# Patient Record
Sex: Female | Born: 1945 | Race: White | Hispanic: No
Health system: Southern US, Community
[De-identification: ages and names within clinical notes are randomized; demographics above are authoritative.]

---

## 2021-09-16 ENCOUNTER — Other Ambulatory Visit: Payer: Self-pay | Admitting: Internal Medicine

## 2021-09-16 DIAGNOSIS — Z1231 Encounter for screening mammogram for malignant neoplasm of breast: Secondary | ICD-10-CM

## 2021-11-25 ENCOUNTER — Ambulatory Visit
Admission: RE | Admit: 2021-11-25 | Discharge: 2021-11-25 | Disposition: A | Discharge status: Home / Self Care | Payer: Federal, State, Local not specified - PPO | Source: Ambulatory Visit | Attending: Internal Medicine | Admitting: Internal Medicine

## 2021-11-25 DIAGNOSIS — Z1231 Encounter for screening mammogram for malignant neoplasm of breast: Secondary | ICD-10-CM

## 2021-12-12 ENCOUNTER — Other Ambulatory Visit: Payer: Self-pay | Admitting: Internal Medicine

## 2021-12-12 DIAGNOSIS — N63 Unspecified lump in unspecified breast: Secondary | ICD-10-CM

## 2021-12-12 DIAGNOSIS — R928 Other abnormal and inconclusive findings on diagnostic imaging of breast: Secondary | ICD-10-CM

## 2021-12-19 ENCOUNTER — Other Ambulatory Visit: Payer: Federal, State, Local not specified - PPO

## 2021-12-25 ENCOUNTER — Other Ambulatory Visit: Payer: Federal, State, Local not specified - PPO

## 2021-12-29 ENCOUNTER — Ambulatory Visit
Admission: RE | Admit: 2021-12-29 | Discharge: 2021-12-29 | Disposition: A | Discharge status: Home / Self Care | Payer: Federal, State, Local not specified - PPO | Source: Ambulatory Visit | Attending: Internal Medicine | Admitting: Internal Medicine

## 2021-12-29 ENCOUNTER — Inpatient Hospital Stay
Admission: RE | Admit: 2021-12-29 | Discharge: 2021-12-29 | Disposition: A | Discharge status: Home / Self Care | Payer: Self-pay | Source: Ambulatory Visit | Attending: *Deleted | Admitting: *Deleted

## 2021-12-29 ENCOUNTER — Other Ambulatory Visit: Payer: Self-pay | Admitting: *Deleted

## 2021-12-29 DIAGNOSIS — N63 Unspecified lump in unspecified breast: Secondary | ICD-10-CM | POA: Diagnosis present

## 2021-12-29 DIAGNOSIS — Z1231 Encounter for screening mammogram for malignant neoplasm of breast: Secondary | ICD-10-CM

## 2021-12-29 DIAGNOSIS — R928 Other abnormal and inconclusive findings on diagnostic imaging of breast: Secondary | ICD-10-CM | POA: Diagnosis present

## 2021-12-31 ENCOUNTER — Other Ambulatory Visit: Payer: Self-pay | Admitting: Internal Medicine

## 2021-12-31 DIAGNOSIS — N63 Unspecified lump in unspecified breast: Secondary | ICD-10-CM

## 2021-12-31 DIAGNOSIS — R928 Other abnormal and inconclusive findings on diagnostic imaging of breast: Secondary | ICD-10-CM

## 2022-01-01 ENCOUNTER — Inpatient Hospital Stay
Admission: RE | Admit: 2022-01-01 | Discharge: 2022-01-01 | Disposition: A | Discharge status: Home / Self Care | Payer: Self-pay | Source: Ambulatory Visit | Attending: *Deleted | Admitting: *Deleted

## 2022-01-01 ENCOUNTER — Other Ambulatory Visit: Payer: Self-pay | Admitting: *Deleted

## 2022-01-01 DIAGNOSIS — Z1231 Encounter for screening mammogram for malignant neoplasm of breast: Secondary | ICD-10-CM

## 2022-01-15 ENCOUNTER — Ambulatory Visit
Admission: RE | Admit: 2022-01-15 | Discharge: 2022-01-15 | Disposition: A | Discharge status: Home / Self Care | Payer: Federal, State, Local not specified - PPO | Source: Ambulatory Visit | Attending: Internal Medicine | Admitting: Internal Medicine

## 2022-01-15 DIAGNOSIS — R928 Other abnormal and inconclusive findings on diagnostic imaging of breast: Secondary | ICD-10-CM | POA: Insufficient documentation

## 2022-01-15 DIAGNOSIS — N63 Unspecified lump in unspecified breast: Secondary | ICD-10-CM

## 2022-01-15 HISTORY — PX: BREAST BIOPSY: SHX20

## 2022-01-16 LAB — SURGICAL PATHOLOGY

## 2022-01-21 ENCOUNTER — Encounter: Payer: Self-pay | Admitting: *Deleted

## 2022-01-21 NOTE — Progress Notes (Signed)
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  Appointment scheduled for surgical consultation with Dr. Windell Moment 01/28/22.   No further needs at this time.

## 2022-05-19 ENCOUNTER — Other Ambulatory Visit: Payer: Self-pay | Admitting: General Surgery

## 2022-05-19 DIAGNOSIS — D241 Benign neoplasm of right breast: Secondary | ICD-10-CM

## 2022-07-21 ENCOUNTER — Ambulatory Visit
Admission: RE | Admit: 2022-07-21 | Discharge: 2022-07-21 | Disposition: A | Discharge status: Home / Self Care | Payer: Federal, State, Local not specified - PPO | Source: Ambulatory Visit | Attending: General Surgery | Admitting: General Surgery

## 2022-07-21 DIAGNOSIS — D241 Benign neoplasm of right breast: Secondary | ICD-10-CM | POA: Diagnosis present

## 2022-10-15 ENCOUNTER — Other Ambulatory Visit: Payer: Self-pay | Admitting: Surgery

## 2022-10-15 DIAGNOSIS — N63 Unspecified lump in unspecified breast: Secondary | ICD-10-CM

## 2022-11-20 ENCOUNTER — Ambulatory Visit
Admission: RE | Admit: 2022-11-20 | Discharge: 2022-11-20 | Disposition: A | Discharge status: Home / Self Care | Payer: Federal, State, Local not specified - PPO | Source: Ambulatory Visit | Attending: Surgery | Admitting: Surgery

## 2022-11-20 DIAGNOSIS — N63 Unspecified lump in unspecified breast: Secondary | ICD-10-CM | POA: Insufficient documentation

## 2022-11-27 ENCOUNTER — Other Ambulatory Visit: Payer: Federal, State, Local not specified - PPO

## 2023-02-08 IMAGING — MG MM DIGITAL SCREENING BILAT W/ TOMO AND CAD
8 series · 8 of 24 positions shown · non-contrast
Comparison: None available.

CLINICAL DATA: Screening.

EXAM:
DIGITAL SCREENING BILATERAL MAMMOGRAM WITH TOMOSYNTHESIS AND CAD
TECHNIQUE: Bilateral screening digital craniocaudal and mediolateral oblique
mammograms were obtained. Bilateral screening digital breast
tomosynthesis was performed. The images were evaluated with
computer-aided detection.

[L MLO synth-2D]
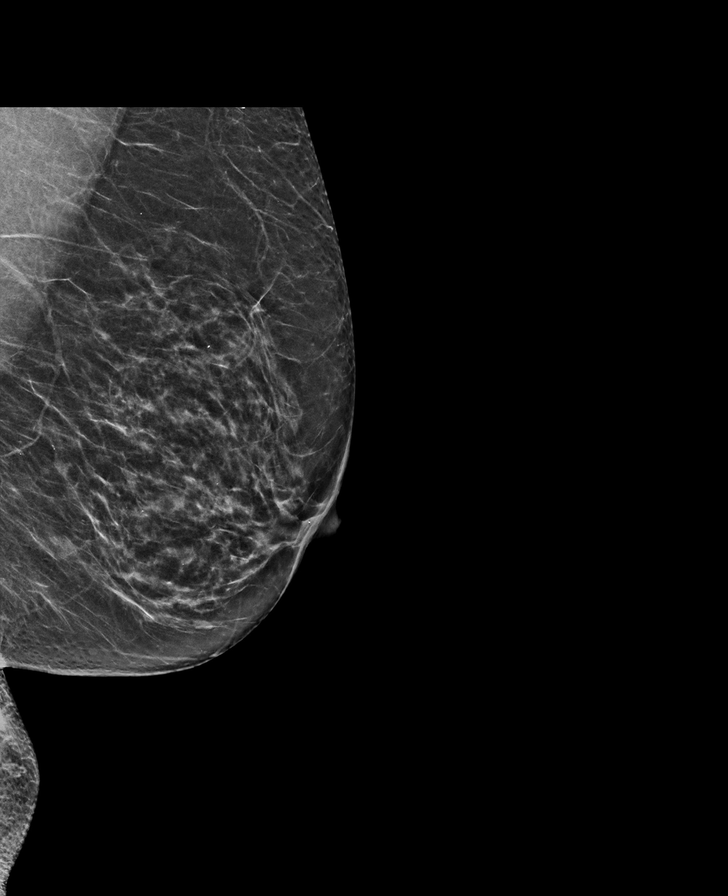

[R CC synth-2D]
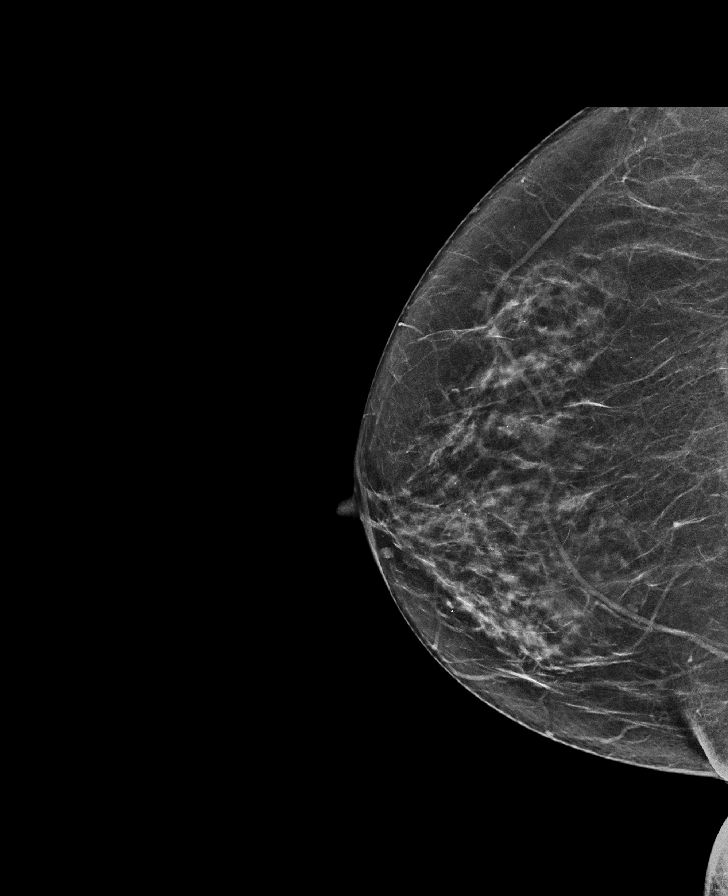

[L CC synth-2D]
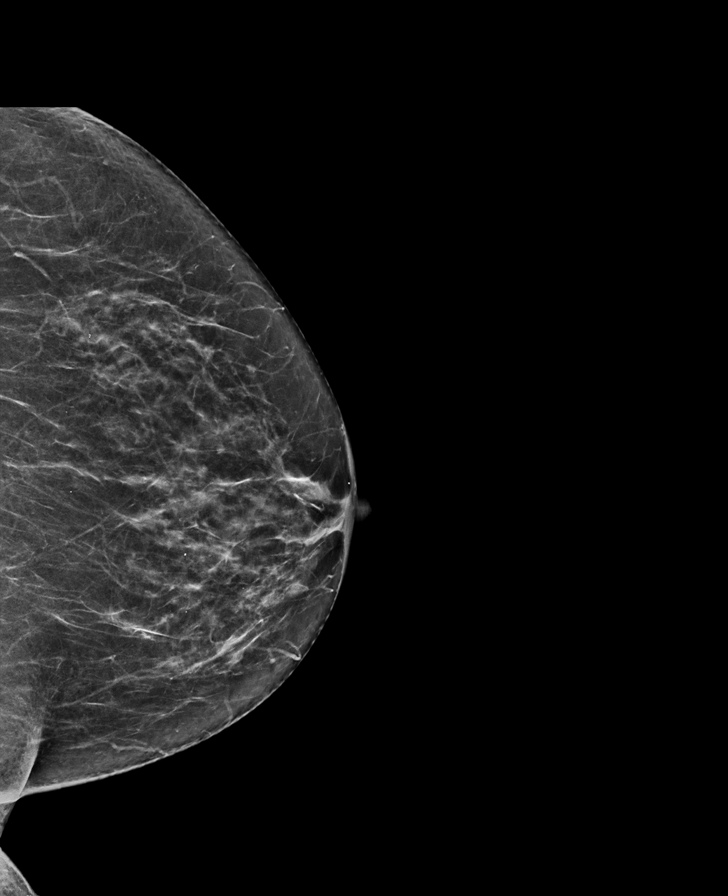

[R MLO synth-2D]
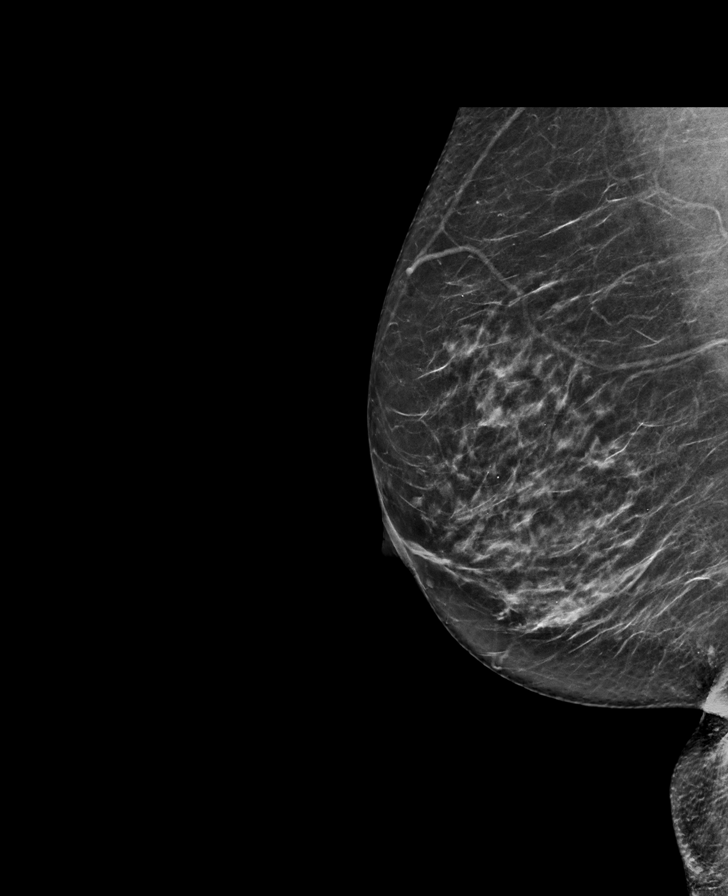

[L CC tomo · tomo slice 33/66.0]
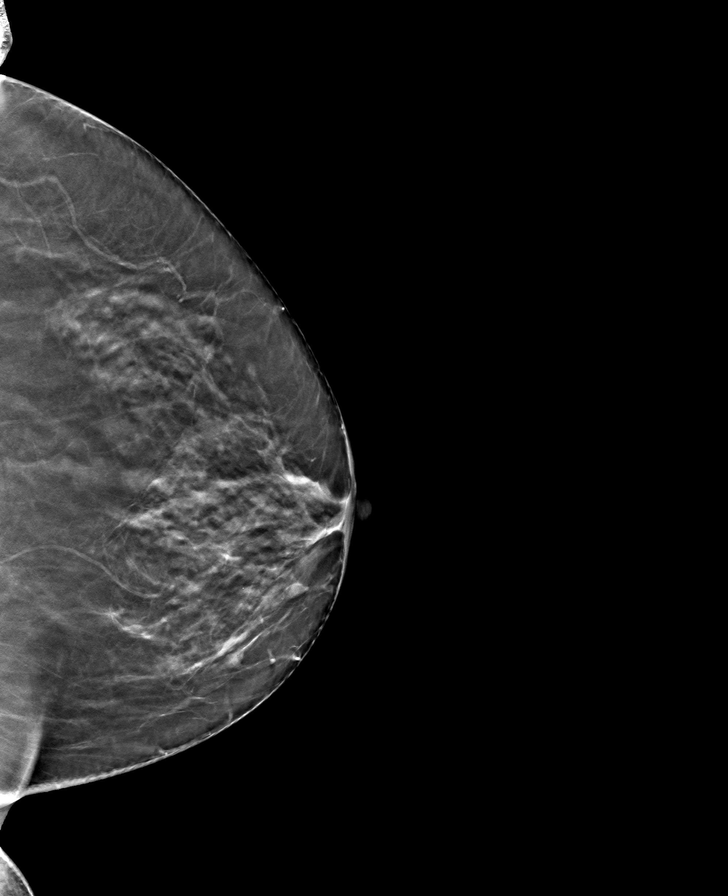

[R MLO tomo · tomo slice 35/70.0]
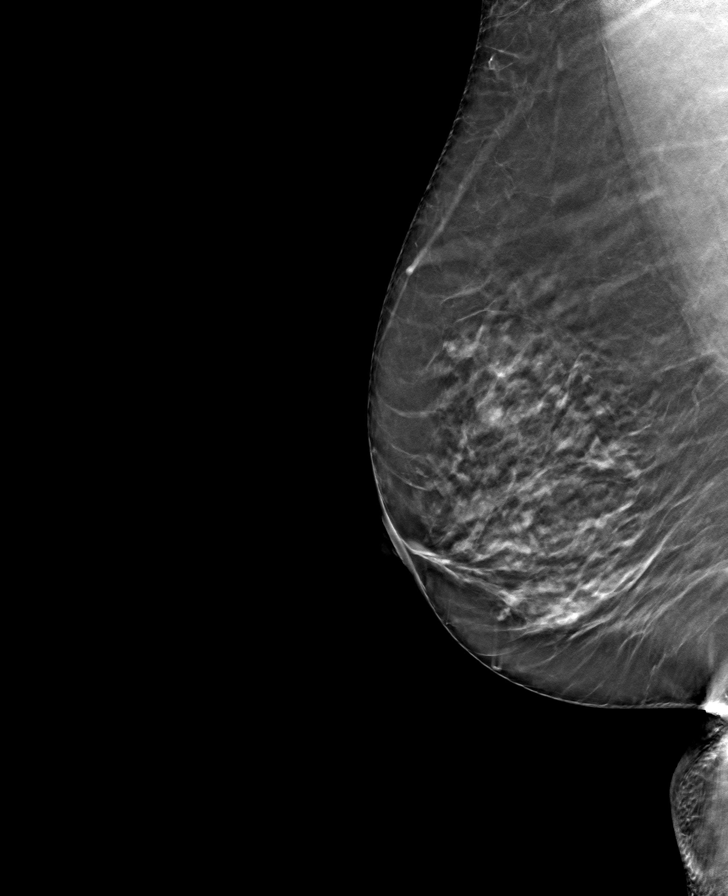

[R CC tomo · tomo slice 35/68.0]
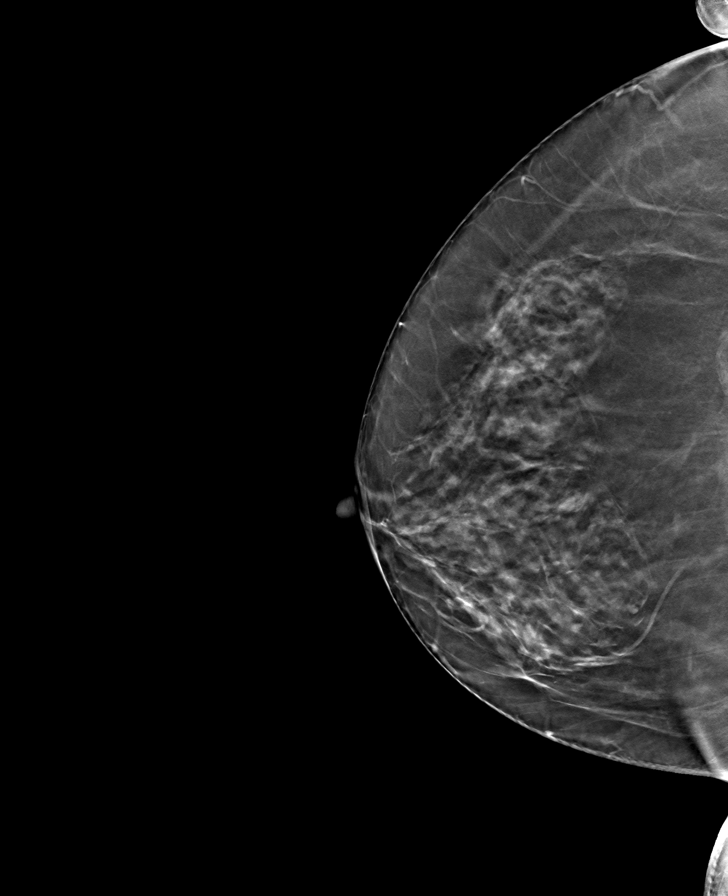

[L MLO tomo · tomo slice 33/64.0]
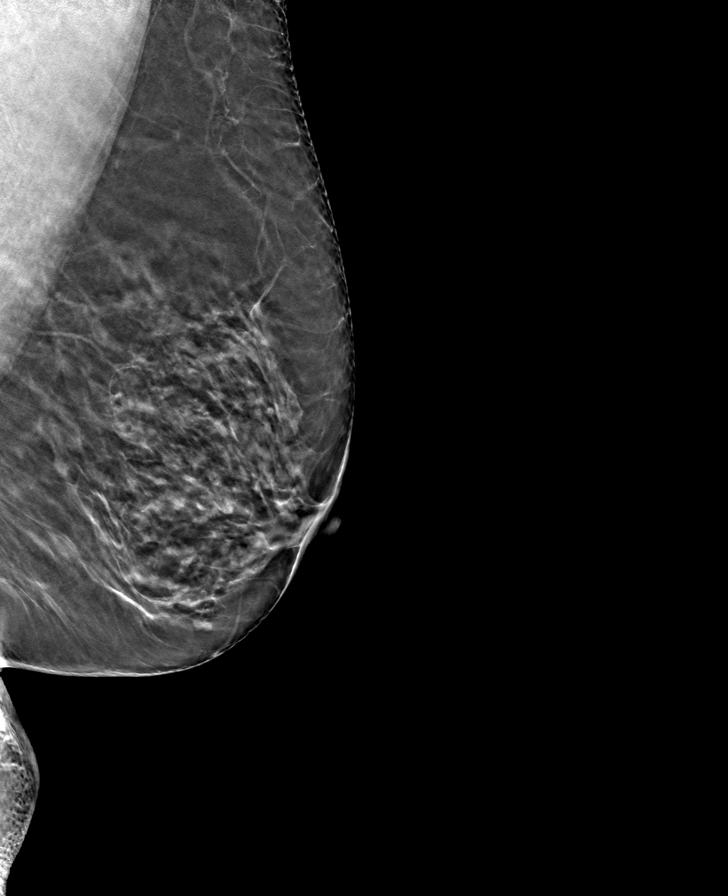

[8 of 24 positions shown; findings below may reference images not displayed]

ACR Breast Density Category b: There are scattered areas of
fibroglandular density.
FINDINGS: In the right breast, a possible mass warrants further evaluation. In
the left breast, no findings suspicious for malignancy.
IMPRESSION: Further evaluation is suggested for possible mass in the right
breast.

RECOMMENDATION:
Ultrasound of the right breast. (Code:[id number])

The patient will be contacted regarding the findings, and additional
imaging will be scheduled.

BI-RADS CATEGORY  0: Incomplete. Need additional imaging evaluation
and/or prior mammograms for comparison.

## 2023-10-12 ENCOUNTER — Other Ambulatory Visit: Payer: Self-pay | Admitting: General Surgery

## 2023-10-12 DIAGNOSIS — Z853 Personal history of malignant neoplasm of breast: Secondary | ICD-10-CM

## 2023-11-24 ENCOUNTER — Ambulatory Visit
Admission: RE | Admit: 2023-11-24 | Discharge: 2023-11-24 | Disposition: A | Discharge status: Home / Self Care | Source: Ambulatory Visit | Attending: General Surgery | Admitting: General Surgery

## 2023-11-24 DIAGNOSIS — Z853 Personal history of malignant neoplasm of breast: Secondary | ICD-10-CM | POA: Diagnosis present
